# Patient Record
Sex: Male | Born: 1995 | Race: Black or African American | Hispanic: No | Marital: Single | Smoking: Current some day smoker
Health system: Southern US, Community
[De-identification: ages and names within clinical notes are randomized; demographics above are authoritative.]

## PROBLEM LIST (undated history)

## (undated) DIAGNOSIS — J302 Other seasonal allergic rhinitis: Secondary | ICD-10-CM

## (undated) DIAGNOSIS — Z889 Allergy status to unspecified drugs, medicaments and biological substances status: Secondary | ICD-10-CM

---
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
## 2015-04-08 ENCOUNTER — Emergency Department (INDEPENDENT_AMBULATORY_CARE_PROVIDER_SITE_OTHER)
Admission: EM | Admit: 2015-04-08 | Discharge: 2015-04-08 | Disposition: A | Discharge status: Home / Self Care | Payer: Medicaid Other | Source: Home / Self Care | Attending: Family Medicine | Admitting: Family Medicine

## 2015-04-08 ENCOUNTER — Encounter (HOSPITAL_COMMUNITY): Payer: Self-pay | Admitting: *Deleted

## 2015-04-08 ENCOUNTER — Other Ambulatory Visit (HOSPITAL_COMMUNITY)
Admission: RE | Admit: 2015-04-08 | Discharge: 2015-04-08 | Disposition: A | Discharge status: Home / Self Care | Payer: Medicaid Other | Source: Ambulatory Visit | Attending: Family Medicine | Admitting: Family Medicine

## 2015-04-08 DIAGNOSIS — Z711 Person with feared health complaint in whom no diagnosis is made: Secondary | ICD-10-CM

## 2015-04-08 DIAGNOSIS — Z113 Encounter for screening for infections with a predominantly sexual mode of transmission: Secondary | ICD-10-CM | POA: Insufficient documentation

## 2015-04-08 NOTE — ED Notes (Signed)
Pt  States  He  Was  Exposed  To  An  Std       He  Wants  To be  Checked  He  denys  Any  Symptoms

## 2015-04-08 NOTE — Discharge Instructions (Signed)
We will call with positive test results and treat as indicated  °

## 2015-04-08 NOTE — ED Provider Notes (Signed)
CSN: [id number]     Arrival date & time 04/08/15  1307 History   First MD Initiated Contact with Patient 04/08/15 1426     Chief Complaint  Patient presents with  . Exposure to STD   (Consider location/radiation/quality/duration/timing/severity/associated sxs/prior Treatment) Patient is a 19 y.o. male presenting with STD exposure. The history is provided by the patient.  Exposure to STD This is a new problem. Episode onset: told by girl that she had chlamydia. The problem has not changed since onset.Associated symptoms comments: Pt with no sx..    Past Medical History  Diagnosis Date  . Asthma   . Multiple allergies    History reviewed. No pertinent past surgical history. History reviewed. No pertinent family history. Social History  Substance Use Topics  . Smoking status: Current Some Day Smoker -- 0.25 packs/day    Types: Cigarettes  . Smokeless tobacco: Never Used  . Alcohol Use: No    Review of Systems  Gastrointestinal: Negative.   Genitourinary: Negative for urgency, discharge, penile swelling, penile pain and testicular pain.  All other systems reviewed and are negative.   Allergies  Review of patient's allergies indicates no known allergies.  Home Medications   Prior to Admission medications   Medication Sig Start Date End Date Taking? Authorizing Provider  lactobacillus acidophilus & bulgar (LACTINEX) chewable tablet Chew 1 tablet by mouth 3 (three) times daily with meals. For 5 days 06/03/13   Truddie Cocoamika Bush, DO   Meds Ordered and Administered this Visit  Medications - No data to display  BP 109/68 mmHg  Pulse 78  Temp(Src) 98.7 F (37.1 C) (Oral)  Resp 16  SpO2 97% No data found.   Physical Exam  Constitutional: He is oriented to person, place, and time. He appears well-developed and well-nourished. No distress.  Abdominal: Soft. Bowel sounds are normal.  Genitourinary: Penis normal. No penile tenderness.  Neurological: He is alert and oriented to  person, place, and time.  Skin: Skin is warm and dry.  Nursing note and vitals reviewed.   ED Course  Procedures (including critical care time)  Labs Review Labs Reviewed  CYTOLOGY, (ORAL, ANAL, URETHRAL) ANCILLARY ONLY    Imaging Review No results found.   Visual Acuity Review  Right Eye Distance:   Left Eye Distance:   Bilateral Distance:    Right Eye Near:   Left Eye Near:    Bilateral Near:         MDM   1. Concern about STD in male without diagnosis        Linna HoffJames D Desira Alessandrini, MD 04/08/15 [id number]

## 2015-04-09 LAB — CYTOLOGY, (ORAL, ANAL, URETHRAL) ANCILLARY ONLY
CHLAMYDIA, DNA PROBE: POSITIVE — AB
NEISSERIA GONORRHEA: NEGATIVE

## 2015-04-09 NOTE — ED Notes (Signed)
Final report of STD testing positive for chlamydia, untreated. Discussed w JD Kindl, who authorized call in order  for azithromycin 1 gram x 1 dose, NR. Called patient , and after verifying ID, discussed positive finding. Called Rx to [facility] at [address], [address] . Spoke directly [facility] staff. Patient was instructed to inform his partner of his positive chlamydia finding, and they are to avoid unprotected sex until 1 week post both parties completing treatment. He is also to practice safer sex. Patient verbalized understanding of plan. Form 2124 DHHS completed and [facility] to [facility] for their records.

## 2018-08-13 ENCOUNTER — Emergency Department (HOSPITAL_COMMUNITY)
Admission: EM | Admit: 2018-08-13 | Discharge: 2018-08-13 | Disposition: A | Discharge status: Home / Self Care | Payer: Self-pay | Attending: Emergency Medicine | Admitting: Emergency Medicine

## 2018-08-13 ENCOUNTER — Encounter (HOSPITAL_COMMUNITY): Payer: Self-pay

## 2018-08-13 ENCOUNTER — Other Ambulatory Visit: Payer: Self-pay

## 2018-08-13 DIAGNOSIS — R067 Sneezing: Secondary | ICD-10-CM | POA: Insufficient documentation

## 2018-08-13 DIAGNOSIS — F17210 Nicotine dependence, cigarettes, uncomplicated: Secondary | ICD-10-CM | POA: Insufficient documentation

## 2018-08-13 DIAGNOSIS — Z0279 Encounter for issue of other medical certificate: Secondary | ICD-10-CM | POA: Insufficient documentation

## 2018-08-13 HISTORY — DX: Other seasonal allergic rhinitis: J30.2

## 2018-08-13 NOTE — ED Triage Notes (Signed)
Pt states he's been sneezing since this am and work needs a note stating he can work.  Pt states he was playing basketball over the weekend outside and there was lots of pollen around.  No shortness of breath or complaints of pain. A&Ox4.

## 2018-08-13 NOTE — ED Provider Notes (Signed)
[facility] EMERGENCY DEPARTMENT Provider Note   CSN: [id number] Arrival date & time: 08/13/18  1811    History   Chief Complaint Chief Complaint  Patient presents with  . Allergies    HPI Thomas Clayton is a 23 y.o. male who presents for a work note.  He states that he has asthma and allergies and he sneezed today while he was at work.  He sneezed multiple times and told his coworkers that it was because of his allergies but they told him that he had to go get checked out.  He was told he needs a work note to go back to work.  He denies fever, chills, URI symptoms, cough, shortness of breath, chest pain.  He has been taking his allergy medicine as prescribed     HPI  Past Medical History:  Diagnosis Date  . Asthma   . Multiple allergies   . Seasonal allergies     There are no active problems to display for this patient.   History reviewed. No pertinent surgical history.      Home Medications    Prior to Admission medications   Medication Sig Start Date End Date Taking? Authorizing Provider  lactobacillus acidophilus & bulgar (LACTINEX) chewable tablet Chew 1 tablet by mouth 3 (three) times daily with meals. For 5 days Patient not taking: Reported on 08/13/2018 06/03/13   Truddie Coco, DO    Family History History reviewed. No pertinent family history.  Social History Social History   Tobacco Use  . Smoking status: Current Some Day Smoker    Packs/day: 0.25    Types: Cigarettes  . Smokeless tobacco: Never Used  Substance Use Topics  . Alcohol use: No  . Drug use: No     Allergies   Patient has no known allergies.   Review of Systems Review of Systems  Constitutional: Negative for fever.  HENT: Positive for sneezing.      Physical Exam Updated Vital Signs BP 132/82 (BP Location: Right Arm)   Pulse 90   Temp 98.3 F (36.8 C) (Oral)   Resp 17   SpO2 95%   Physical Exam Vitals signs and nursing note reviewed.   Constitutional:      General: He is not in acute distress.    Appearance: Normal appearance. He is well-developed. He is not ill-appearing.  HENT:     Head: Normocephalic and atraumatic.     Right Ear: Tympanic membrane normal.     Left Ear: Tympanic membrane normal.     Nose: Nose normal.     Mouth/Throat:     Mouth: Mucous membranes are moist.  Eyes:     General: No scleral icterus.       Right eye: No discharge.        Left eye: No discharge.     Conjunctiva/sclera: Conjunctivae normal.     Pupils: Pupils are equal, round, and reactive to light.  Neck:     Musculoskeletal: Normal range of motion.  Cardiovascular:     Rate and Rhythm: Normal rate and regular rhythm.  Pulmonary:     Effort: Pulmonary effort is normal. No respiratory distress.     Breath sounds: Normal breath sounds.  Abdominal:     General: There is no distension.  Skin:    General: Skin is warm and dry.  Neurological:     Mental Status: He is alert and oriented to person, place, and time.  Psychiatric:  Behavior: Behavior normal.      ED Treatments / Results  Labs (all labs ordered are listed, but only abnormal results are displayed) Labs Reviewed - No data to display  EKG None  Radiology No results found.  Procedures Procedures (including critical care time)  Medications Ordered in ED Medications - No data to display   Initial Impression / Assessment and Plan / ED Course  I have reviewed the triage vital signs and the nursing notes.  Pertinent labs & imaging results that were available during my care of the patient were reviewed by me and considered in my medical decision making (see chart for details).  23 year old male who is primarily here for a work note after sneezing at work today.  His vitals are normal.  Exam is unremarkable.  Symptoms sound clearly like allergies.  Work note was given.  Final Clinical Impressions(s) / ED Diagnoses   Final diagnoses:  [facility]    ED  Discharge Orders    None       Beryle Quant 08/13/18 1949    Raeford Razor, MD 08/13/18 2002

## 2018-08-26 ENCOUNTER — Emergency Department (HOSPITAL_COMMUNITY)
Admission: EM | Admit: 2018-08-26 | Discharge: 2018-08-26 | Disposition: A | Discharge status: Home / Self Care | Payer: Self-pay | Attending: Emergency Medicine | Admitting: Emergency Medicine

## 2018-08-26 ENCOUNTER — Other Ambulatory Visit: Payer: Self-pay

## 2018-08-26 ENCOUNTER — Encounter (HOSPITAL_COMMUNITY): Payer: Self-pay | Admitting: *Deleted

## 2018-08-26 DIAGNOSIS — F17210 Nicotine dependence, cigarettes, uncomplicated: Secondary | ICD-10-CM | POA: Insufficient documentation

## 2018-08-26 DIAGNOSIS — Z09 Encounter for follow-up examination after completed treatment for conditions other than malignant neoplasm: Secondary | ICD-10-CM | POA: Insufficient documentation

## 2018-08-26 DIAGNOSIS — J45909 Unspecified asthma, uncomplicated: Secondary | ICD-10-CM | POA: Insufficient documentation

## 2018-08-26 DIAGNOSIS — R067 Sneezing: Secondary | ICD-10-CM | POA: Insufficient documentation

## 2018-08-26 NOTE — ED Notes (Signed)
Patient verbalized understanding of discharge instructions and denies any further needs or questions at this time. VS stable. Patient ambulatory with steady gait.  

## 2018-08-26 NOTE — ED Provider Notes (Signed)
[facility] EMERGENCY DEPARTMENT Provider Note   CSN: [id number] Arrival date & time: 08/26/18  1231    History   Chief Complaint Chief Complaint  Patient presents with  . Letter for School/Work    HPI LEXTON HEARN is a 23 y.o. male.     JANI BROUHARD is a 23 y.o. male  with a PMH of asthma, allergies who presents to the Emergency Department requesting work note. Patient states that he is unable to go back to work until he has a note. He was seen in ED on 3/23 after sneezing at work. He has been home and self-quaranting since then. He reports that his symptoms then were c/w seasonal allergies. Provider note sounds c/w this as well. He reports that he is no longer sneezing. Never had any coughing or fevers. Feels back to his usual state of health and asymptomatic. Would like to go back to work.  The history is provided by the patient and medical records. No language interpreter was used.    Past Medical History:  Diagnosis Date  . Asthma   . Multiple allergies   . Seasonal allergies     There are no active problems to display for this patient.   History reviewed. No pertinent surgical history.      Home Medications    Prior to Admission medications   Medication Sig Start Date End Date Taking? Authorizing Provider  lactobacillus acidophilus & bulgar (LACTINEX) chewable tablet Chew 1 tablet by mouth 3 (three) times daily with meals. For 5 days Patient not taking: Reported on 08/13/2018 06/03/13   Truddie Coco, DO    Family History History reviewed. No pertinent family history.  Social History Social History   Tobacco Use  . Smoking status: Current Some Day Smoker    Packs/day: 0.25    Types: Cigarettes  . Smokeless tobacco: Never Used  Substance Use Topics  . Alcohol use: No  . Drug use: No     Allergies   Other   Review of Systems Review of Systems  HENT: Positive for sneezing (Resolved).   All other systems reviewed and are  negative.    Physical Exam Updated Vital Signs BP (!) 141/86 (BP Location: Right Arm)   Pulse 74   Temp 98.2 F (36.8 C) (Oral)   Resp 15   Ht 5\' 7"  (1.702 m)   Wt 88 kg   SpO2 97%   BMI 30.38 kg/m   Physical Exam Vitals signs and nursing note reviewed.  Constitutional:      General: He is not in acute distress.    Appearance: He is well-developed.  HENT:     Head: Normocephalic and atraumatic.  Neck:     Musculoskeletal: Neck supple.  Cardiovascular:     Rate and Rhythm: Normal rate and regular rhythm.     Heart sounds: Normal heart sounds. No murmur.  Pulmonary:     Effort: Pulmonary effort is normal. No respiratory distress.     Breath sounds: Normal breath sounds.  Abdominal:     General: There is no distension.     Palpations: Abdomen is soft.     Tenderness: There is no abdominal tenderness.  Skin:    General: Skin is warm and dry.  Neurological:     Mental Status: He is alert and oriented to person, place, and time.      ED Treatments / Results  Labs (all labs ordered are listed, but only abnormal results are displayed)  Labs Reviewed - No data to display  EKG None  Radiology No results found.  Procedures Procedures (including critical care time)  Medications Ordered in ED Medications - No data to display   Initial Impression / Assessment and Plan / ED Course  I have reviewed the triage vital signs and the nursing notes.  Pertinent labs & imaging results that were available during my care of the patient were reviewed by me and considered in my medical decision making (see chart for details).       TAELYN AMAN is a 23 y.o. male who presents to ED requesting work note. Seen in ED on 3/23. Chart reviewed from this encounter. He states that since he sneezed at work, employer would not let him return to work without a note. He has had no symptoms for the last week. Never had any cough, fever or GI symptoms. Symptoms sound much more c/w  seasonal allergies to begin with. Benign exam. Low risk. Feel he can return to work as he has self quarantined and asymptomatic for a week now. Note provided.   Patient discussed with Dr. Juleen China who agrees with treatment plan.   Final Clinical Impressions(s) / ED Diagnoses   Final diagnoses:  Follow-up exam    ED Discharge Orders    None       Karthik Whittinghill, [place], [place] 08/26/18 1323    Raeford Razor, MD 08/26/18 1505

## 2018-08-26 NOTE — ED Triage Notes (Signed)
Pt reports his company called off all employees for one week and now wants all workers to come back to work.  Pt 's  job now wants a return to work note. Pt denies cough ,sore throat  or fever.

## 2018-08-26 NOTE — Discharge Instructions (Signed)
Return to ER for new symptoms or any additional concerns.

## 2019-01-12 ENCOUNTER — Ambulatory Visit (HOSPITAL_COMMUNITY)
Admission: EM | Admit: 2019-01-12 | Discharge: 2019-01-13 | Disposition: A | Discharge status: Home / Self Care | Payer: Self-pay | Attending: Orthopedic Surgery | Admitting: Orthopedic Surgery

## 2019-01-12 ENCOUNTER — Emergency Department (HOSPITAL_COMMUNITY): Payer: Self-pay

## 2019-01-12 ENCOUNTER — Emergency Department (HOSPITAL_COMMUNITY): Payer: Self-pay | Admitting: Certified Registered Nurse Anesthetist

## 2019-01-12 ENCOUNTER — Other Ambulatory Visit: Payer: Self-pay

## 2019-01-12 ENCOUNTER — Emergency Department (HOSPITAL_COMMUNITY)
Admission: EM | Admit: 2019-01-12 | Discharge: 2019-01-12 | Discharge status: Against Medical Advice | Payer: Self-pay | Attending: Emergency Medicine | Admitting: Emergency Medicine

## 2019-01-12 ENCOUNTER — Encounter (HOSPITAL_COMMUNITY)
Admission: EM | Disposition: A | Discharge status: Home / Self Care | Payer: Self-pay | Source: Home / Self Care | Attending: Emergency Medicine

## 2019-01-12 ENCOUNTER — Encounter (HOSPITAL_COMMUNITY): Payer: Self-pay | Admitting: *Deleted

## 2019-01-12 DIAGNOSIS — W2209XA Striking against other stationary object, initial encounter: Secondary | ICD-10-CM | POA: Insufficient documentation

## 2019-01-12 DIAGNOSIS — S6291XA Unspecified fracture of right wrist and hand, initial encounter for closed fracture: Secondary | ICD-10-CM

## 2019-01-12 DIAGNOSIS — S62141A Displaced fracture of body of hamate [unciform] bone, right wrist, initial encounter for closed fracture: Secondary | ICD-10-CM | POA: Insufficient documentation

## 2019-01-12 DIAGNOSIS — Z5320 Procedure and treatment not carried out because of patient's decision for unspecified reasons: Secondary | ICD-10-CM | POA: Insufficient documentation

## 2019-01-12 DIAGNOSIS — Y999 Unspecified external cause status: Secondary | ICD-10-CM | POA: Insufficient documentation

## 2019-01-12 DIAGNOSIS — Y929 Unspecified place or not applicable: Secondary | ICD-10-CM | POA: Insufficient documentation

## 2019-01-12 DIAGNOSIS — J45909 Unspecified asthma, uncomplicated: Secondary | ICD-10-CM | POA: Insufficient documentation

## 2019-01-12 DIAGNOSIS — Y939 Activity, unspecified: Secondary | ICD-10-CM | POA: Insufficient documentation

## 2019-01-12 DIAGNOSIS — S62342A Nondisplaced fracture of base of third metacarpal bone, right hand, initial encounter for closed fracture: Secondary | ICD-10-CM | POA: Insufficient documentation

## 2019-01-12 DIAGNOSIS — F17210 Nicotine dependence, cigarettes, uncomplicated: Secondary | ICD-10-CM | POA: Insufficient documentation

## 2019-01-12 DIAGNOSIS — S62346A Nondisplaced fracture of base of fifth metacarpal bone, right hand, initial encounter for closed fracture: Secondary | ICD-10-CM | POA: Insufficient documentation

## 2019-01-12 DIAGNOSIS — S62309A Unspecified fracture of unspecified metacarpal bone, initial encounter for closed fracture: Secondary | ICD-10-CM

## 2019-01-12 DIAGNOSIS — S62344A Nondisplaced fracture of base of fourth metacarpal bone, right hand, initial encounter for closed fracture: Secondary | ICD-10-CM | POA: Insufficient documentation

## 2019-01-12 DIAGNOSIS — S62316A Displaced fracture of base of fifth metacarpal bone, right hand, initial encounter for closed fracture: Secondary | ICD-10-CM | POA: Insufficient documentation

## 2019-01-12 DIAGNOSIS — S62314A Displaced fracture of base of fourth metacarpal bone, right hand, initial encounter for closed fracture: Secondary | ICD-10-CM | POA: Insufficient documentation

## 2019-01-12 DIAGNOSIS — Z20828 Contact with and (suspected) exposure to other viral communicable diseases: Secondary | ICD-10-CM | POA: Insufficient documentation

## 2019-01-12 DIAGNOSIS — S62312A Displaced fracture of base of third metacarpal bone, right hand, initial encounter for closed fracture: Secondary | ICD-10-CM | POA: Insufficient documentation

## 2019-01-12 HISTORY — PX: PERCUTANEOUS PINNING: SHX2209

## 2019-01-12 LAB — SARS CORONAVIRUS 2 (TAT 6-24 HRS): SARS Coronavirus 2: NEGATIVE

## 2019-01-12 SURGERY — PINNING, EXTREMITY, PERCUTANEOUS
Anesthesia: General | Laterality: Right

## 2019-01-12 MED ORDER — ONDANSETRON HCL 4 MG/2ML IJ SOLN
4.0000 mg | Freq: Once | INTRAMUSCULAR | Status: AC
  Administered 2019-01-12: 4 mg via INTRAVENOUS
  Filled 2019-01-12: qty 2

## 2019-01-12 MED ORDER — FENTANYL CITRATE (PF) 250 MCG/5ML IJ SOLN
INTRAMUSCULAR | Status: AC
  Filled 2019-01-12: qty 5

## 2019-01-12 MED ORDER — BUPIVACAINE HCL (PF) 0.5 % IJ SOLN
INTRAMUSCULAR | Status: AC
  Filled 2019-01-12: qty 30

## 2019-01-12 MED ORDER — MIDAZOLAM HCL 5 MG/5ML IJ SOLN
INTRAMUSCULAR | Status: DC | PRN
  Administered 2019-01-12: 2 mg via INTRAVENOUS

## 2019-01-12 MED ORDER — PROPOFOL 10 MG/ML IV BOLUS
INTRAVENOUS | Status: AC
  Filled 2019-01-12: qty 20

## 2019-01-12 MED ORDER — MIDAZOLAM HCL 2 MG/2ML IJ SOLN
INTRAMUSCULAR | Status: AC
  Filled 2019-01-12: qty 2

## 2019-01-12 MED ORDER — PROPOFOL 10 MG/ML IV BOLUS
INTRAVENOUS | Status: DC | PRN
  Administered 2019-01-12: 50 mg via INTRAVENOUS
  Administered 2019-01-12: 200 mg via INTRAVENOUS

## 2019-01-12 MED ORDER — ONDANSETRON HCL 4 MG/2ML IJ SOLN
INTRAMUSCULAR | Status: DC | PRN
  Administered 2019-01-12: 4 mg via INTRAVENOUS

## 2019-01-12 MED ORDER — SUGAMMADEX SODIUM 200 MG/2ML IV SOLN
INTRAVENOUS | Status: DC | PRN
  Administered 2019-01-12: 200 mg via INTRAVENOUS

## 2019-01-12 MED ORDER — FENTANYL CITRATE (PF) 100 MCG/2ML IJ SOLN
INTRAMUSCULAR | Status: DC | PRN
  Administered 2019-01-12: 50 ug via INTRAVENOUS
  Administered 2019-01-12: 150 ug via INTRAVENOUS
  Administered 2019-01-12: 50 ug via INTRAVENOUS

## 2019-01-12 MED ORDER — OXYCODONE-ACETAMINOPHEN 5-325 MG PO TABS
1.0000 | ORAL_TABLET | Freq: Once | ORAL | Status: AC
  Administered 2019-01-12: 1 via ORAL
  Filled 2019-01-12: qty 1

## 2019-01-12 MED ORDER — HYDROMORPHONE HCL 1 MG/ML IJ SOLN
0.5000 mg | Freq: Once | INTRAMUSCULAR | Status: AC
  Administered 2019-01-12: 19:00:00 0.5 mg via INTRAVENOUS
  Filled 2019-01-12: qty 1

## 2019-01-12 MED ORDER — HYDROMORPHONE HCL 1 MG/ML IJ SOLN
INTRAMUSCULAR | Status: AC
  Filled 2019-01-12: qty 0.5

## 2019-01-12 MED ORDER — ROCURONIUM BROMIDE 100 MG/10ML IV SOLN
INTRAVENOUS | Status: DC | PRN
  Administered 2019-01-12: 50 mg via INTRAVENOUS

## 2019-01-12 MED ORDER — LIDOCAINE HCL (CARDIAC) PF 100 MG/5ML IV SOSY
PREFILLED_SYRINGE | INTRAVENOUS | Status: DC | PRN
  Administered 2019-01-12: 60 mg via INTRAVENOUS

## 2019-01-12 MED ORDER — HYDROMORPHONE HCL 1 MG/ML IJ SOLN
INTRAMUSCULAR | Status: DC | PRN
  Administered 2019-01-12: 0.5 mg via INTRAVENOUS

## 2019-01-12 MED ORDER — BUPIVACAINE HCL (PF) 0.25 % IJ SOLN
INTRAMUSCULAR | Status: DC | PRN
  Administered 2019-01-12: 10 mL

## 2019-01-12 MED ORDER — ACETAMINOPHEN 325 MG PO TABS
650.0000 mg | ORAL_TABLET | Freq: Once | ORAL | Status: AC
  Administered 2019-01-12: 650 mg via ORAL
  Filled 2019-01-12: qty 2

## 2019-01-12 MED ORDER — DEXAMETHASONE SODIUM PHOSPHATE 4 MG/ML IJ SOLN
INTRAMUSCULAR | Status: DC | PRN
  Administered 2019-01-12: 5 mg via INTRAVENOUS

## 2019-01-12 MED ORDER — PROPOFOL 1000 MG/100ML IV EMUL
INTRAVENOUS | Status: AC
  Filled 2019-01-12: qty 100

## 2019-01-12 MED ORDER — LACTATED RINGERS IV SOLN
INTRAVENOUS | Status: DC | PRN
  Administered 2019-01-12: 22:00:00 via INTRAVENOUS

## 2019-01-12 MED ORDER — BUPIVACAINE HCL (PF) 0.25 % IJ SOLN
INTRAMUSCULAR | Status: AC
  Filled 2019-01-12: qty 10

## 2019-01-12 SURGICAL SUPPLY — 57 items
APL PRP STRL LF DISP 70% ISPRP (MISCELLANEOUS) ×1
APL SKNCLS STERI-STRIP NONHPOA (GAUZE/BANDAGES/DRESSINGS) ×1
BENZOIN TINCTURE PRP APPL 2/3 (GAUZE/BANDAGES/DRESSINGS) ×3 IMPLANT
BLADE CLIPPER SURG (BLADE) ×3 IMPLANT
BNDG CMPR 9X4 STRL LF SNTH (GAUZE/BANDAGES/DRESSINGS) ×1
BNDG COHESIVE 2X5 TAN STRL LF (GAUZE/BANDAGES/DRESSINGS) IMPLANT
BNDG ELASTIC 3X5.8 VLCR STR LF (GAUZE/BANDAGES/DRESSINGS) ×3 IMPLANT
BNDG ELASTIC 4X5.8 VLCR STR LF (GAUZE/BANDAGES/DRESSINGS) ×3 IMPLANT
BNDG ESMARK 4X9 LF (GAUZE/BANDAGES/DRESSINGS) ×3 IMPLANT
BNDG GAUZE ELAST 4 BULKY (GAUZE/BANDAGES/DRESSINGS) ×3 IMPLANT
CHLORAPREP W/TINT 26 (MISCELLANEOUS) ×3 IMPLANT
CLOSURE WOUND 1/2 X4 (GAUZE/BANDAGES/DRESSINGS) ×1
CORD BIPOLAR FORCEPS 12FT (ELECTRODE) ×3 IMPLANT
COVER SURGICAL LIGHT HANDLE (MISCELLANEOUS) ×3 IMPLANT
COVER WAND RF STERILE (DRAPES) ×3 IMPLANT
CUFF TOURN SGL QUICK 18X4 (TOURNIQUET CUFF) IMPLANT
CUFF TOURN SGL QUICK 24 (TOURNIQUET CUFF)
CUFF TRNQT CYL 24X4X16.5-23 (TOURNIQUET CUFF) IMPLANT
DRAPE OEC MINIVIEW 54X84 (DRAPES) ×6 IMPLANT
DRAPE SURG 17X23 STRL (DRAPES) ×3 IMPLANT
DRSG EMULSION OIL 3X3 NADH (GAUZE/BANDAGES/DRESSINGS) ×3 IMPLANT
DRSG XEROFORM 1X8 (GAUZE/BANDAGES/DRESSINGS) ×2 IMPLANT
GAUZE SPONGE 4X4 12PLY STRL (GAUZE/BANDAGES/DRESSINGS) ×3 IMPLANT
GAUZE SPONGE 4X4 12PLY STRL LF (GAUZE/BANDAGES/DRESSINGS) ×2 IMPLANT
GAUZE XEROFORM 1X8 LF (GAUZE/BANDAGES/DRESSINGS) ×3 IMPLANT
GLOVE BIO SURGEON STRL SZ7.5 (GLOVE) ×3 IMPLANT
GLOVE BIOGEL PI IND STRL 8 (GLOVE) ×1 IMPLANT
GLOVE BIOGEL PI INDICATOR 8 (GLOVE) ×2
GOWN STRL REUS W/ TWL LRG LVL3 (GOWN DISPOSABLE) ×3 IMPLANT
GOWN STRL REUS W/TWL LRG LVL3 (GOWN DISPOSABLE) ×9
K-WIRE DBL TROCAR .045X4 ×12 IMPLANT
KIT BASIN OR (CUSTOM PROCEDURE TRAY) ×3 IMPLANT
KIT TURNOVER KIT B (KITS) ×3 IMPLANT
KWIRE DBL TROCAR .045X4 IMPLANT
MANIFOLD NEPTUNE II (INSTRUMENTS) ×3 IMPLANT
NDL HYPO 25GX1X1/2 BEV (NEEDLE) ×1 IMPLANT
NEEDLE HYPO 25GX1X1/2 BEV (NEEDLE) ×3 IMPLANT
NS IRRIG 1000ML POUR BTL (IV SOLUTION) ×3 IMPLANT
PACK ORTHO EXTREMITY (CUSTOM PROCEDURE TRAY) ×3 IMPLANT
PAD ARMBOARD 7.5X6 YLW CONV (MISCELLANEOUS) ×6 IMPLANT
PAD CAST 3X4 CTTN HI CHSV (CAST SUPPLIES) IMPLANT
PADDING CAST COTTON 3X4 STRL (CAST SUPPLIES) ×3
SPLINT PLASTER EXTRA FAST 3X15 (CAST SUPPLIES) ×2
SPLINT PLASTER GYPS XFAST 3X15 (CAST SUPPLIES) IMPLANT
STRIP CLOSURE SKIN 1/2X4 (GAUZE/BANDAGES/DRESSINGS) ×2 IMPLANT
SUCTION FRAZIER HANDLE 10FR (MISCELLANEOUS) ×2
SUCTION TUBE FRAZIER 10FR DISP (MISCELLANEOUS) ×1 IMPLANT
SUT ETHILON 4 0 P 3 18 (SUTURE) IMPLANT
SUT ETHILON 4 0 PS 2 18 (SUTURE) ×4 IMPLANT
SUT PROLENE 4 0 P 3 18 (SUTURE) IMPLANT
SYR CONTROL 10ML LL (SYRINGE) ×3 IMPLANT
TOWEL GREEN STERILE (TOWEL DISPOSABLE) ×3 IMPLANT
TOWEL GREEN STERILE FF (TOWEL DISPOSABLE) ×3 IMPLANT
TUBE CONNECTING 12'X1/4 (SUCTIONS) ×1
TUBE CONNECTING 12X1/4 (SUCTIONS) ×2 IMPLANT
TUBE FEEDING ENTERAL 5FR 16IN (TUBING) IMPLANT
WATER STERILE IRR 1000ML POUR (IV SOLUTION) ×3 IMPLANT

## 2019-01-12 NOTE — ED Triage Notes (Signed)
PT walked out with Family member  AMA.

## 2019-01-12 NOTE — ED Triage Notes (Signed)
PT returns after leaving AMA because he refused COVID -19 swab

## 2019-01-12 NOTE — Op Note (Signed)
NAME: Thomas Clayton RECORD NO: [id number] DATE OF BIRTH: Jun 11, 1995 FACILITY: [facility] LOCATION: [facility] OR PHYSICIAN: Tennis Must, MD   OPERATIVE REPORT   DATE OF PROCEDURE: 01/12/19    PREOPERATIVE DIAGNOSIS:   Right long finger metacarpal base fracture right ring finger CMC fracture dislocation right small finger CMC dislocation right hamate fracture   POSTOPERATIVE DIAGNOSIS:  Right long finger metacarpal base fracture right ring finger CMC fracture dislocation right small finger CMC dislocation right hamate fracture   PROCEDURE:   1.  Right long finger reduction and pinning of metacarpal base fracture 2.  Right ring finger reduction and pinning of CMC fracture dislocation 3.  Right small finger reduction and pinning CMC dislocation 4.  Closed reduction and pinning of hamate fracture 5.  Prophylactic fasciotomies right hand   SURGEON:  Leanora Cover, M.D.   ASSISTANT: none   ANESTHESIA:  General   INTRAVENOUS FLUIDS:  Per anesthesia flow sheet.   ESTIMATED BLOOD LOSS:  Minimal.   COMPLICATIONS:  None.   SPECIMENS:  none   TOURNIQUET TIME:    Total Tourniquet Time Documented: Upper Arm (Right) - 94 minutes Total: Upper Arm (Right) - 94 minutes    DISPOSITION:  Stable to PACU.   INDICATIONS: 23 year old right-hand-dominant male states he punched a wall earlier today injuring his right hand.  Seen at [facility] emergency department where radiographs were taken revealing fracture of the base of the long finger metacarpal a fracture dislocation of the ring finger carpometacarpal joint with fracture of the metacarpal base dislocation of the small finger metacarpal CMC joint fracture of the dorsal rim of the hamate.  Recommended operative reduction and pinning with prophylactic fasciotomies. Risks, benefits and alternatives of surgery were discussed including the risks of blood loss, infection, damage to nerves, vessels, tendons, ligaments, bone for surgery, need for  additional surgery, complications with wound healing, continued pain, nonunion, malunion, stiffness.  He voiced understanding of these risks and elected to proceed.  OPERATIVE COURSE:  After being identified preoperatively by myself,  the patient and I agreed on the procedure and site of the procedure.  The surgical site was marked.  Surgical consent had been signed. He was given IV antibiotics as preoperative antibiotic prophylaxis. He was transferred to the operating room and placed on the operating table in supine position with the Right upper extremity on an arm board.  General anesthesia was induced by the anesthesiologist.  Right upper extremity was prepped and draped in normal sterile orthopedic fashion.  A surgical pause was performed between the surgeons, anesthesia, and operating room staff and all were in agreement as to the patient, procedure, and site of procedure.  Tourniquet at the proximal aspect of the extremity was inflated to 250 mmHg after exsanguination of the arm with an Esmarch bandage.    C-arm was used to aid in reduction of the fractures.  The fracture dislocations were able to be reduced but were unstable.  An incision was made over the ring finger metacarpal and carried in subcutaneous tissues by spreading technique.  Bipolar electrocautery is used to obtain hemostasis.  Fasciotomies of the hand were performed.  The fascia between the ring and small fingers was released.  The muscle was spread to release deep fascia.  The fascia between the long and ring finger metacarpals was released and again the muscle was spread to release any deep fascia.  Additional incision was made over the index finger metacarpal.  This is again carried in subcutaneous  tissues by spreading technique.  Bipolar cautery was used to obtain hemostasis.  There was hematoma in the dorsum of the hand.  The fascia between the index and long finger metacarpals was released again spreading the muscle to release any deep  fascia.  The fascia between the thumb and index finger metacarpals was released.  The muscle all appeared viable.  The wounds were copiously irrigated with sterile saline.  They were closed with 4-0 nylon in a horizontal mattress fashion.  There was no undue tension.  C-arm was again used in AP and lateral projections to aid in reduction of the fractures and dislocations.  0.045 inch K wires were then used.  2 K wires were used to stabilize the long finger metacarpal base fracture.  A 0.045 inch K wire was then used to stabilize the fracture dislocation of the ring finger CMC joint and base of metacarpal fractures.  A 0.045 inch K wire was used to stabilize the dislocation of the small finger CMC joint.  C-arm was used in AP lateral oblique projections to ensure appropriate reduction position of heart which was the case.  There was a fracture of the dorsal rim of the hamate.  This piece seemed large enough to stabilize with a pin.  A 0.045 inch K wire was then used to capture this fragment aid in reduction of the fragment and pin it to the body of the hamate.  C-arm was again used in AP lateral oblique projections to ensure appropriate reduction position of heart which was the case.  Wrist was placed through tenodesis and there was no scissoring.  The pins were all bent and cut short.  The surgical field was injected with quarter percent plain Marcaine to aid in postoperative analgesia.  The wounds and pin sites were dressed with sterile Xeroform 4 x 4's and wrapped with a Kerlix bandage.  A volar dorsal slab splint including the long ring and small fingers was placed with the MPs flexed and the IP is extended.  This was wrapped with Kerlix and Ace bandage.  The tourniquet was deflated at 94 minutes.  Fingertips were pink with brisk capillary refill after deflation of tourniquet.  The operative  drapes were broken down.  The patient was awoken from anesthesia safely.  He was transferred back to the stretcher and  taken to PACU in stable condition.  I will see him back in the office in 1 week for postoperative followup.  I will give him a prescription for Percocet 5/325 1-2 tabs PO q6 hours prn pain, dispense # 30.   Betha LoaKevin Ej Pinson, MD Electronically signed, 01/12/19

## 2019-01-12 NOTE — ED Notes (Addendum)
Patient transported to CT 

## 2019-01-12 NOTE — H&P (Signed)
Thomas Clayton is an 23 y.o. male.   Chief Complaint: right hand fracture/dislocation HPI: 23 yo rhd male states he punched a wall earlier today injuring right hand.  Reports no previous injury to right hand and no other injury at this time.  Describes a throbbing pain that is alleviated by rest and aggravated with motion or palpation.  Associated swelling of hand.  Case discussed with Carmon Sails, [facility] and her note from 01/12/2019 reviewed. Xrays viewed and interpreted by me: 3 views right hand and CT right wrist show long finger metacarpal base fracture, ring finger metacarpal base fracture dislocation, small finger cmc dislocation, hamate dorsal rim fracture Labs reviewed: none  Allergies:  Allergies  Allergen Reactions  . Other Hives    Past Medical History:  Diagnosis Date  . Asthma   . Multiple allergies   . Seasonal allergies     History reviewed. No pertinent surgical history.  Family History: History reviewed. No pertinent family history.  Social History:   reports that he has been smoking cigarettes. He has been smoking about 0.25 packs per day. He has never used smokeless tobacco. He reports that he does not drink alcohol or use drugs.  Medications: (Not in a hospital admission)   No results found for this or any previous visit (from the past 48 hour(s)).  Ct Hand Right Wo Contrast  Result Date: 01/12/2019 CLINICAL DATA:  Right hand pain and swelling.  Hit the wall. EXAM: CT OF THE RIGHT HAND WITHOUT CONTRAST TECHNIQUE: Multidetector CT imaging of the right hand was performed according to the standard protocol. Multiplanar CT image reconstructions were also generated. COMPARISON:  None. FINDINGS: Bones/Joint/Cartilage Acute comminuted fracture at the base of the third metacarpal with 10 mm of dorsal displacement and without involvement of the articular surface. Comminuted fracture of the base of the fourth metacarpal with 10 mm of dorsal displacement involving the  articular surface. Base of the fourth metacarpal is dorsally dislocated and perched on the distal dorsal corner of the capitate. Comminuted fracture of the distal dorsal aspect of the hamate with 9 mm of maximum dorsal displacement. Dorsal dislocation of the fifth metacarpal base perched on the dorsal distal aspect of the hamate between the hamate and the dorsally displaced hamate fracture fragments. No other acute fracture or dislocation. No aggressive osseous lesion. No periosteal reaction. CMC joint effusion. Ligaments Suboptimally assessed by CT. Muscles and Tendons Muscles are normal. No muscle atrophy. Flexor and extensor compartment tendons are intact. Soft tissues No fluid collection or hematoma. No aggressive osseous lesion. Soft tissue edema around the dorsal aspect of the CMC joints. IMPRESSION: 1. Acute comminuted fracture at the base of the third metacarpal with 10 mm of dorsal displacement and without involvement of the articular surface. 2. Comminuted fracture of the base of the fourth metacarpal with 10 mm of dorsal displacement involving the articular surface. Base of the fourth metacarpal is dorsally dislocated and perched on the distal dorsal corner of the capitate. 3. Comminuted fracture of the distal dorsal aspect of the hamate with 9 mm of maximum dorsal displacement. 4. Dorsal dislocation of the fifth metacarpal base perched on the dorsal distal aspect of the hamate between the hamate and the dorsally displaced hamate fracture fragments. Electronically Signed   By: Kathreen Devoid   On: 01/12/2019 15:16   Dg Hand Complete Right  Result Date: 01/12/2019 CLINICAL DATA:  Trauma, injury, pain EXAM: RIGHT HAND - COMPLETE 3+ VIEW COMPARISON:  None. FINDINGS: There are acute  fractures of the right third, fourth, and fifth metacarpal bases and associated dorsal dislocation of the fourth and fifth metacarpal bases in relation to the carpal bones. Osseous irregularity over the hamate bone. Difficult to  exclude hamate fracture. Other carpal bones appear intact. Distal radius and ulna intact. Diffuse and soft tissue swelling. IMPRESSION: Acute right third, fourth and fifth metacarpal base fractures and associated fourth and fifth metacarpal base dislocations in relation to the carpal bones. Difficult to exclude hamate carpal bone fracture as well Marked soft tissue swelling. Electronically Signed   By: Judie PetitM.  Shick M.D.   On: 01/12/2019 12:58     A comprehensive review of systems was negative. Review of Systems: No fevers, chills, night sweats, chest pain, shortness of breath, nausea, vomiting, diarrhea, constipation, easy bleeding or bruising, headaches, dizziness, vision changes, fainting.   Blood pressure (!) 141/89, pulse 68, temperature 98.2 F (36.8 C), temperature source Oral, resp. rate 16, height 5\' 6"  (1.676 m), weight 86.2 kg, SpO2 98 %.  General appearance: alert, cooperative and appears stated age Head: Normocephalic, without obvious abnormality, atraumatic Neck: supple, symmetrical, trachea midline Resp: clear to auscultation bilaterally Cardio: regular rate and rhythm Extremities: Intact sensation and capillary refill all digits.  +epl/fpl/io.  No wounds. Right hand swollen.  Able to wiggle fingers lightly. Pulses: 2+ and symmetric Skin: Skin color, texture, turgor normal. No rashes or lesions Neurologic: Grossly normal Incision/Wound: none  Assessment/Plan Right hand long/ring/small fractures/cmc fracture dislocations and hamate fracture.  Recommend OR for reduction and fixation of metacarpal fractures and cmc fracture dislocations and hamate fracture with prophylactic fasciotomies of hand.  Risks, benefits and alternatives of surgery were discussed including risks of blood loss, infection, damage to nerves/vessels/tendons/ligament/bone, failure of surgery, need for additional surgery, complication with wound healing, stiffness, post traumatic arthritis.  He voiced understanding of  these risks and elected to proceed.    Betha LoaKevin Sierra Spargo 01/12/2019, 6:50 PM

## 2019-01-12 NOTE — ED Notes (Signed)
Patient provided ice to apply to injury per EDP, Harris.

## 2019-01-12 NOTE — ED Provider Notes (Signed)
[facility] EMERGENCY DEPARTMENT Provider Note   CSN: [id number] Arrival date & time: 01/12/19  1001     History   Chief Complaint Chief Complaint  Patient presents with  . Hand Pain    HPI Thomas Clayton is a 23 y.o. male presents to the ER for evaluation of right hand pain.  Sudden onset today after he punched a wall.  The pain is mild to moderate, worse with any movement and palpation.  Associated with swelling to the hand.  He is right-hand dominant.  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  He denies any other injuries.  He denies any tingling or loss of sensation distally.  No previous history of surgeries or injuries to that hand.  No interventions.  Alleviating factors.     HPI  Past Medical History:  Diagnosis Date  . Asthma   . Multiple allergies   . Seasonal allergies     There are no active problems to display for this patient.   History reviewed. No pertinent surgical history.      Home Medications    Prior to Admission medications   Medication Sig Start Date End Date Taking? Authorizing Provider  lactobacillus acidophilus & bulgar (LACTINEX) chewable tablet Chew 1 tablet by mouth 3 (three) times daily with meals. For 5 days Patient not taking: Reported on 08/13/2018 06/03/13   Glynis Smiles, DO    Family History No family history on file.  Social History Social History   Tobacco Use  . Smoking status: Current Some Day Smoker    Packs/day: 0.25    Types: Cigarettes  . Smokeless tobacco: Never Used  Substance Use Topics  . Alcohol use: No  . Drug use: No     Allergies   Other   Review of Systems Review of Systems  Musculoskeletal: Positive for arthralgias and joint swelling.  All other systems reviewed and are negative.    Physical Exam Updated Vital Signs BP (!) 150/99 (BP Location: Left Arm)   Pulse 97   Temp 98.9 F (37.2 C) (Oral)    Resp 16   Ht 5\' 6"  (1.676 m)   Wt 86.2 kg   SpO2 98%   BMI 30.67 kg/m   Physical Exam Constitutional:      Appearance: He is well-developed.  HENT:     Head: Normocephalic.     Nose: Nose normal.  Eyes:     General: Lids are normal.  Neck:     Musculoskeletal: Normal range of motion.  Cardiovascular:     Rate and Rhythm: Normal rate.  Pulmonary:     Effort: Pulmonary effort is normal. No respiratory distress.  Musculoskeletal: Normal range of motion.        General: Swelling and tenderness present.     Comments: Market edema to the entire right dorsum hand.  There is diffuse tenderness along the second, third, fourth, fifth metacarpals but most severely at the base of the third fourth and fifth.  Mild tenderness to the lateral dorsal wrist bones.  No focal bony tenderness to the finger IP joints, phalanges or MCPs.  No point tenderness to the scaphoid.  Negative scaphoid compression test.  Patient can make a full fist with some pain and there is no significant finger deviation outward.  Neurological:     Mental Status: He is alert.  Psychiatric:        Behavior:  Behavior normal.      ED Treatments / Results  Labs (all labs ordered are listed, but only abnormal results are displayed) Labs Reviewed  SARS CORONAVIRUS 2 (HOSPITAL ORDER[facility])    EKG None  Radiology Ct Hand Right Wo Contrast  Result Date: 01/12/2019 CLINICAL DATA:  Right hand pain and swelling.  Hit the wall. EXAM: CT OF THE RIGHT HAND WITHOUT CONTRAST TECHNIQUE: Multidetector CT imaging of the right hand was performed according to the standard protocol. Multiplanar CT image reconstructions were also generated. COMPARISON:  None. FINDINGS: Bones/Joint/Cartilage Acute comminuted fracture at the base of the third metacarpal with 10 mm of dorsal displacement and without involvement of the articular surface. Comminuted fracture of the base of the fourth metacarpal with 10 mm of  dorsal displacement involving the articular surface. Base of the fourth metacarpal is dorsally dislocated and perched on the distal dorsal corner of the capitate. Comminuted fracture of the distal dorsal aspect of the hamate with 9 mm of maximum dorsal displacement. Dorsal dislocation of the fifth metacarpal base perched on the dorsal distal aspect of the hamate between the hamate and the dorsally displaced hamate fracture fragments. No other acute fracture or dislocation. No aggressive osseous lesion. No periosteal reaction. CMC joint effusion. Ligaments Suboptimally assessed by CT. Muscles and Tendons Muscles are normal. No muscle atrophy. Flexor and extensor compartment tendons are intact. Soft tissues No fluid collection or hematoma. No aggressive osseous lesion. Soft tissue edema around the dorsal aspect of the CMC joints. IMPRESSION: 1. Acute comminuted fracture at the base of the third metacarpal with 10 mm of dorsal displacement and without involvement of the articular surface. 2. Comminuted fracture of the base of the fourth metacarpal with 10 mm of dorsal displacement involving the articular surface. Base of the fourth metacarpal is dorsally dislocated and perched on the distal dorsal corner of the capitate. 3. Comminuted fracture of the distal dorsal aspect of the hamate with 9 mm of maximum dorsal displacement. 4. Dorsal dislocation of the fifth metacarpal base perched on the dorsal distal aspect of the hamate between the hamate and the dorsally displaced hamate fracture fragments. Electronically Signed   By: Elige KoHetal  Patel   On: 01/12/2019 15:16   Dg Hand Complete Right  Result Date: 01/12/2019 CLINICAL DATA:  Trauma, injury, pain EXAM: RIGHT HAND - COMPLETE 3+ VIEW COMPARISON:  None. FINDINGS: There are acute fractures of the right third, fourth, and fifth metacarpal bases and associated dorsal dislocation of the fourth and fifth metacarpal bases in relation to the carpal bones. Osseous irregularity  over the hamate bone. Difficult to exclude hamate fracture. Other carpal bones appear intact. Distal radius and ulna intact. Diffuse and soft tissue swelling. IMPRESSION: Acute right third, fourth and fifth metacarpal base fractures and associated fourth and fifth metacarpal base dislocations in relation to the carpal bones. Difficult to exclude hamate carpal bone fracture as well Marked soft tissue swelling. Electronically Signed   By: Judie PetitM.  Shick M.D.   On: 01/12/2019 12:58    Procedures Procedures (including critical care time)  Medications Ordered in ED Medications  oxyCODONE-acetaminophen (PERCOCET/ROXICET) 5-325 MG per tablet 1 tablet (1 tablet Oral Given 01/12/19 1204)  acetaminophen (TYLENOL) tablet 650 mg (650 mg Oral Given 01/12/19 1204)     Initial Impression / Assessment and Plan / ED Course  I have reviewed the triage vital signs and the nursing notes.  Pertinent labs & imaging results that were available during my care of  the patient were reviewed by me and considered in my medical decision making (see chart for details).  Clinical Course as of Jan 11 1625  Sat Jan 12, 2019  1305 There are acute fractures of the right third, fourth, and fifth metacarpal bases and associated dorsal dislocation of the fourth and fifth metacarpal bases in relation to the carpal bones. Osseous irregularity over the hamate bone. Difficult to exclude hamate fracture. Other carpal bones appear intact. Distal radius and ulna intact. Diffuse and soft tissue swelling.  IMPRESSION: Acute right third, fourth and fifth metacarpal base fractures and associated fourth and fifth metacarpal base dislocations in relation to the carpal bones.  Difficult to exclude hamate carpal bone fracture as well  Marked soft tissue swelling.  DG Hand Complete Right [CG]  1625 IMPRESSION: 1. Acute comminuted fracture at the base of the third metacarpal with 10 mm of dorsal displacement and without involvement of the  articular surface. 2. Comminuted fracture of the base of the fourth metacarpal with 10 mm of dorsal displacement involving the articular surface. Base of the fourth metacarpal is dorsally dislocated and perched on the distal dorsal corner of the capitate. 3. Comminuted fracture of the distal dorsal aspect of the hamate with 9 mm of maximum dorsal displacement. 4. Dorsal dislocation of the fifth metacarpal base perched on the dorsal distal aspect of the hamate between the hamate and the dorsally displaced hamate fracture fragments.   CT Hand Right Wo Contrast [CG]    Clinical Course User Index [CG] Liberty HandyGibbons,  J, PA-C   Traumatic multiple right metacarpal fractures at the bases with dislocation and hamate fracture.  Extremity is neurovascularly intact.  He has good range of motion of the digits.  No other injuries.  He is right-hand dominant.  Discussed x-ray with Dr. Merlyn LotKuzma who recommends admission for orthopedic surgery, CT of the hand.  Discussed results with patient and father at bedside.  Patient became very angry and stated he did not want to be tested for COVID because he does not have it.  Explained that this is needed for surgery.  He was upset that he had been waiting in the ER for 5 hours to be told that he had to wait longer for surgery.  Apologized for delay but explained that surgery is recommended due to his multiple fractures on dominant hand.  Father assisted in de-escalating and patient ultimately was agreeable to obtain CT and COVID test for surgery.  Plan for admission by Dr. Merlyn LotKuzma for surgery tonight.  1625: While I was in another patient's room RN reported patient became upset again and was fighting with the father.  Patient walked out of the ER AMA.  I was not able to speak to him prior to leaving the hospital.  I called Dr. Merlyn LotKuzma to notify him of this.  Final Clinical Impressions(s) / ED Diagnoses   Final diagnoses:  Closed fracture of multiple metacarpal bones,  initial encounter    ED Discharge Orders    None       Liberty HandyGibbons,  J, PA-C 01/12/19 1626    Margarita Grizzleay, Danielle, MD 01/13/19 1122

## 2019-01-12 NOTE — ED Notes (Signed)
Ice pack applied.

## 2019-01-12 NOTE — ED Triage Notes (Signed)
C/o pain in right hand swelling , states he got mad and hit a wall.

## 2019-01-12 NOTE — Anesthesia Procedure Notes (Signed)
Procedure Name: Intubation Date/Time: 01/12/2019 9:55 PM Performed by: Oletta Lamas, CRNA Pre-anesthesia Checklist: Patient identified, Emergency Drugs available, Suction available and Patient being monitored Patient Re-evaluated:Patient Re-evaluated prior to induction Oxygen Delivery Method: Circle System Utilized Preoxygenation: Pre-oxygenation with 100% oxygen Induction Type: IV induction Ventilation: Mask ventilation without difficulty Laryngoscope Size: Mac and 4 Grade View: Grade I Tube type: Oral Tube size: 7.5 mm Number of attempts: 1 Airway Equipment and Method: Stylet Placement Confirmation: ETT inserted through vocal cords under direct vision,  positive ETCO2 and breath sounds checked- equal and bilateral Secured at: 23 cm Tube secured with: Tape Dental Injury: Teeth and Oropharynx as per pre-operative assessment

## 2019-01-12 NOTE — ED Triage Notes (Signed)
Pt wanted DC papers when walking out. Pt informed DC papers not available  When leaving AMA.

## 2019-01-12 NOTE — ED Notes (Signed)
Pt back from X-ray.  

## 2019-01-12 NOTE — ED Notes (Signed)
Pt changed to hospital gown and socks. Personal clothes and belongings placed in belonging bags with pt.

## 2019-01-12 NOTE — ED Triage Notes (Signed)
Pt agitated pacing in room . Unable to collect  COVID  19 . Pt reports it is his right what to do with his body. Nobody is going stick that up my nose.

## 2019-01-12 NOTE — ED Notes (Signed)
Patient transported to CT 

## 2019-01-12 NOTE — ED Triage Notes (Signed)
Reported to PA that Pt walked out with Family member.

## 2019-01-12 NOTE — ED Notes (Signed)
Pt taken to the OR.  Family took belongings to car.  Family waiting in car, would like to be called when the surgery is complete.  They will wait until pt is ready for discharge to take pt home.  Laurey Arrow - [phone] - family contact.  RN contacted family by phone to update them.

## 2019-01-12 NOTE — ED Notes (Signed)
Paged Dr. Fredna Dow Per. PA Carmon Sails.

## 2019-01-12 NOTE — Anesthesia Preprocedure Evaluation (Addendum)
Anesthesia Evaluation  Patient identified by MRN, date of birth, ID band Patient awake    Reviewed: Allergy & Precautions, NPO status , Patient's Chart, lab work & pertinent test results  Airway Mallampati: II  TM Distance: >3 FB Neck ROM: Full    Dental no notable dental hx.    Pulmonary asthma , Current SmokerPatient did not abstain from smoking.,    Pulmonary exam normal breath sounds clear to auscultation       Cardiovascular negative cardio ROS Normal cardiovascular exam Rhythm:Regular Rate:Normal     Neuro/Psych negative neurological ROS  negative psych ROS   GI/Hepatic negative GI ROS, (+)     substance abuse  ,   Endo/Other  negative endocrine ROS  Renal/GU negative Renal ROS     Musculoskeletal negative musculoskeletal ROS (+)   Abdominal (+) + obese,   Peds  Hematology negative hematology ROS (+)   Anesthesia Other Findings hand injury  Reproductive/Obstetrics                            Anesthesia Physical Anesthesia Plan  ASA: II and emergent  Anesthesia Plan: General   Post-op Pain Management:    Induction: Intravenous  PONV Risk Score and Plan: 1 and Treatment may vary due to age or medical condition, Ondansetron, Dexamethasone and Midazolam  Airway Management Planned: Oral ETT  Additional Equipment:   Intra-op Plan:   Post-operative Plan: Extubation in OR  Informed Consent: I have reviewed the patients History and Physical, chart, labs and discussed the procedure including the risks, benefits and alternatives for the proposed anesthesia with the patient or authorized representative who has indicated his/her understanding and acceptance.     Dental advisory given  Plan Discussed with: CRNA  Anesthesia Plan Comments:         Anesthesia Quick Evaluation

## 2019-01-12 NOTE — ED Provider Notes (Signed)
[facility]   CSN: [id number] Arrival date & time: 01/12/19  1620     History   Chief Complaint Chief Complaint  Patient presents with   Hand Injury    HPI Lahoma RockerDeion L Sorter is a 23 y.o. male  Who returns after eloping this morning. He has a Known R hand  Fracture dislocation after punching a wall this morning. He states that he wanted his wife to be with him. He needs ORIF. Dr. Merlyn LotKuzma called the patient and asked him to return to the ED. Patient has agreed to the Covid test. He denies numbness or tingling. He is R hand dominant. He is NPO since yesterday.     HPI  Past Medical History:  Diagnosis Date   Asthma    Multiple allergies    Seasonal allergies     There are no active problems to display for this patient.   History reviewed. No pertinent surgical history.      Home Medications    Prior to Admission medications   Medication Sig Start Date End Date Taking? Authorizing Provider  oxyCODONE-acetaminophen (PERCOCET) 5-325 MG tablet 1-2 tabs PO q6 hours prn pain 01/13/19   Betha LoaKuzma, Kevin, MD    Family History History reviewed. No pertinent family history.  Social History Social History   Tobacco Use   Smoking status: Current Some Day Smoker    Packs/day: 0.25    Types: Cigarettes   Smokeless tobacco: Never Used  Substance Use Topics   Alcohol use: No   Drug use: No     Allergies   Other   Review of Systems Review of Systems  Ten systems reviewed and are negative for acute change, except as noted in the HPI.   Physical Exam Updated Vital Signs BP (!) 171/98 (BP Location: Left Arm)    Pulse 91    Temp (!) 97 F (36.1 C)    Resp (!) 28    Ht 5\' 6"  (1.676 m)    Wt 86.2 kg    SpO2 100%    BMI 30.67 kg/m   Physical Exam Vitals signs and nursing note reviewed.  Constitutional:      General: He is not in acute distress.    Appearance: He is well-developed. He is not diaphoretic.  HENT:     Head: Normocephalic and atraumatic.  Eyes:     General: No scleral icterus.    Conjunctiva/sclera: Conjunctivae normal.  Neck:     Musculoskeletal: Normal range of motion and neck supple.  Cardiovascular:     Rate and Rhythm: Normal rate and regular rhythm.     Heart sounds: Normal heart sounds.  Pulmonary:     Effort: Pulmonary effort is normal. No respiratory distress.     Breath sounds: Normal breath sounds.  Abdominal:     Palpations: Abdomen is soft.     Tenderness: There is no abdominal tenderness.  Musculoskeletal:     Comments: Remarkably swelling with the bruising on the palmar surface.  Able to wiggle fingers, cap refill less than 2 seconds, normal ipsilateral elbow and shoulder examination.  Skin:    General: Skin is warm and dry.  Neurological:     Mental Status: He is alert.  Psychiatric:        Behavior: Behavior normal.      ED Treatments / Results  Labs (all labs ordered are listed, but only abnormal results are displayed) Labs Reviewed  SARS CORONAVIRUS 2  EKG None  Radiology Ct Hand Right Wo Contrast  Result Date: 01/12/2019 CLINICAL DATA:  Right hand pain and swelling.  Hit the wall. EXAM: CT OF THE RIGHT HAND WITHOUT CONTRAST TECHNIQUE: Multidetector CT imaging of the right hand was performed according to the standard protocol. Multiplanar CT image reconstructions were also generated. COMPARISON:  None. FINDINGS: Bones/Joint/Cartilage Acute comminuted fracture at the base of the third metacarpal with 10 mm of dorsal displacement and without involvement of the articular surface. Comminuted fracture of the base of the fourth metacarpal with 10 mm of dorsal displacement involving the articular surface. Base of the fourth metacarpal is dorsally dislocated and perched on the distal dorsal corner of the capitate. Comminuted fracture of the distal dorsal aspect of the hamate with 9 mm of maximum dorsal displacement. Dorsal dislocation of the fifth metacarpal  base perched on the dorsal distal aspect of the hamate between the hamate and the dorsally displaced hamate fracture fragments. No other acute fracture or dislocation. No aggressive osseous lesion. No periosteal reaction. CMC joint effusion. Ligaments Suboptimally assessed by CT. Muscles and Tendons Muscles are normal. No muscle atrophy. Flexor and extensor compartment tendons are intact. Soft tissues No fluid collection or hematoma. No aggressive osseous lesion. Soft tissue edema around the dorsal aspect of the CMC joints. IMPRESSION: 1. Acute comminuted fracture at the base of the third metacarpal with 10 mm of dorsal displacement and without involvement of the articular surface. 2. Comminuted fracture of the base of the fourth metacarpal with 10 mm of dorsal displacement involving the articular surface. Base of the fourth metacarpal is dorsally dislocated and perched on the distal dorsal corner of the capitate. 3. Comminuted fracture of the distal dorsal aspect of the hamate with 9 mm of maximum dorsal displacement. 4. Dorsal dislocation of the fifth metacarpal base perched on the dorsal distal aspect of the hamate between the hamate and the dorsally displaced hamate fracture fragments. Electronically Signed   By: Kathreen Devoid   On: 01/12/2019 15:16   Dg Hand Complete Right  Result Date: 01/12/2019 CLINICAL DATA:  Trauma, injury, pain EXAM: RIGHT HAND - COMPLETE 3+ VIEW COMPARISON:  None. FINDINGS: There are acute fractures of the right third, fourth, and fifth metacarpal bases and associated dorsal dislocation of the fourth and fifth metacarpal bases in relation to the carpal bones. Osseous irregularity over the hamate bone. Difficult to exclude hamate fracture. Other carpal bones appear intact. Distal radius and ulna intact. Diffuse and soft tissue swelling. IMPRESSION: Acute right third, fourth and fifth metacarpal base fractures and associated fourth and fifth metacarpal base dislocations in relation to  the carpal bones. Difficult to exclude hamate carpal bone fracture as well Marked soft tissue swelling. Electronically Signed   By: Jerilynn Mages.  Shick M.D.   On: 01/12/2019 12:58    Procedures Procedures (including critical care time)  Medications Ordered in ED Medications  oxyCODONE (Oxy IR/ROXICODONE) immediate release tablet 5 mg (has no administration in time range)    Or  oxyCODONE (ROXICODONE) 5 MG/5ML solution 5 mg (has no administration in time range)  HYDROmorphone (DILAUDID) injection 0.25-0.5 mg (has no administration in time range)  promethazine (PHENERGAN) injection 6.25-12.5 mg (has no administration in time range)  ketorolac (TORADOL) 30 MG/ML injection 30 mg (has no administration in time range)  HYDROmorphone (DILAUDID) injection 0.5 mg (0.5 mg Intravenous Given 01/12/19 1843)  ondansetron (ZOFRAN) injection 4 mg (4 mg Intravenous Given 01/12/19 1841)  bupivacaine (MARCAINE) 0.5 % injection (has no administration in time  range)  bupivacaine (PF) (MARCAINE) 0.25 % injection (has no administration in time range)     Initial Impression / Assessment and Plan / ED Course  I have reviewed the triage vital signs and the nursing notes.  Pertinent labs & imaging results that were available during my care of the patient were reviewed by me and considered in my medical decision making (see chart for details).        Patient here with hand fracture.  He is scheduled for ORIF this evening with Dr. Merlyn LotKuzma.  The coronavirus test is pending.  Pain control initiated here.  Neurovascularly intact  Final Clinical Impressions(s) / ED Diagnoses   Final diagnoses:  Closed fracture of right hand, initial encounter    ED Discharge Orders         Ordered    oxyCODONE-acetaminophen (PERCOCET) 5-325 MG tablet     01/13/19 0002           Arthor CaptainHarris, Chyanna Flock, PA-C 01/13/19 0020    Tegeler, Canary Brimhristopher J, MD 01/14/19 1029

## 2019-01-12 NOTE — ED Triage Notes (Signed)
Pt REFUSES  COVID 19  Nasal swab. Pt pacing in room arguing with Family member in room. Pt reported he does not need that SH.Marland Kitchen

## 2019-01-12 NOTE — ED Notes (Signed)
Patient verbalizes understanding of discharge instructions . Opportunity for questions and answers were provided . Armband removed by staff ,Pt discharged from ED. W/C  offered at D/C  and Declined W/C at D/C and was escorted to lobby by RN.  

## 2019-01-13 MED ORDER — OXYCODONE HCL 5 MG PO TABS: 5.0000 mg | ORAL_TABLET | Freq: Once | ORAL | Status: DC | PRN

## 2019-01-13 MED ORDER — HYDROMORPHONE HCL 1 MG/ML IJ SOLN
0.2500 mg | INTRAMUSCULAR | Status: DC | PRN
  Administered 2019-01-13: 0.25 mg via INTRAVENOUS

## 2019-01-13 MED ORDER — KETOROLAC TROMETHAMINE 30 MG/ML IJ SOLN
INTRAMUSCULAR | Status: AC
  Filled 2019-01-13: qty 1

## 2019-01-13 MED ORDER — PROMETHAZINE HCL 25 MG/ML IJ SOLN: 6.2500 mg | INTRAMUSCULAR | Status: DC | PRN

## 2019-01-13 MED ORDER — OXYCODONE-ACETAMINOPHEN 5-325 MG PO TABS: ORAL_TABLET | ORAL | 0 refills | Status: DC

## 2019-01-13 MED ORDER — KETOROLAC TROMETHAMINE 30 MG/ML IJ SOLN
30.0000 mg | Freq: Once | INTRAMUSCULAR | Status: AC | PRN
  Administered 2019-01-13: 30 mg via INTRAVENOUS

## 2019-01-13 MED ORDER — HYDROMORPHONE HCL 1 MG/ML IJ SOLN
INTRAMUSCULAR | Status: AC
  Filled 2019-01-13: qty 1

## 2019-01-13 MED ORDER — OXYCODONE HCL 5 MG/5ML PO SOLN: 5.0000 mg | Freq: Once | ORAL | Status: DC | PRN

## 2019-01-13 NOTE — Progress Notes (Signed)
Notified Anesthesia MD pt's SBP 171 MD ok with sending pt home.  Pain controlled.

## 2019-01-13 NOTE — Anesthesia Postprocedure Evaluation (Signed)
Anesthesia Post Note  Patient: Thomas Clayton  Procedure(s) Performed: RIGHT HAND REDUCTION PINNING OF MULTIPLE METACARPALS DISLOCATIONS WITH PROPHYLACTIC FASCIOTOMY (Right )     Patient location during evaluation: PACU Anesthesia Type: General Level of consciousness: awake and alert Pain management: pain level controlled Vital Signs Assessment: post-procedure vital signs reviewed and stable Respiratory status: spontaneous breathing, nonlabored ventilation, respiratory function stable and patient connected to nasal cannula oxygen Cardiovascular status: blood pressure returned to baseline and stable Postop Assessment: no apparent nausea or vomiting Anesthetic complications: no    Last Vitals:  Vitals:   01/13/19 0015 01/13/19 0030  BP: (!) 171/98 (!) 171/99  Pulse: 91 94  Resp: (!) 28 17  Temp:  36.4 C  SpO2: 100% 100%    Last Pain:  Vitals:   01/13/19 0030  TempSrc:   PainSc: 0-No pain                 Tequila Rottmann P Fynlee Rowlands

## 2019-01-13 NOTE — Transfer of Care (Signed)
Immediate Anesthesia Transfer of Care Note  Patient: Thomas Clayton  Procedure(s) Performed: RIGHT HAND REDUCTION PINNING OF MULTIPLE METACARPALS DISLOCATIONS WITH PROPHYLACTIC FASCIOTOMY (Right )  Patient Location: PACU  Anesthesia Type:General  Level of Consciousness: drowsy, patient cooperative and responds to stimulation  Airway & Oxygen Therapy: Patient Spontanous Breathing  Post-op Assessment: Report given to RN and Post -op Vital signs reviewed and stable  Post vital signs: Reviewed and stable  Last Vitals:  Vitals Value Taken Time  BP 171/99 01/13/19 0030  Temp 36.4 C 01/13/19 0030  Pulse 88 01/13/19 0033  Resp 21 01/13/19 0033  SpO2 100 % 01/13/19 0033  Vitals shown include unvalidated device data.  Last Pain:  Vitals:   01/13/19 0030  TempSrc:   PainSc: 0-No pain         Complications: No apparent anesthesia complications

## 2019-01-13 NOTE — Discharge Instructions (Signed)

## 2019-01-14 ENCOUNTER — Encounter (HOSPITAL_COMMUNITY): Payer: Self-pay | Admitting: Orthopedic Surgery

## 2019-09-26 ENCOUNTER — Other Ambulatory Visit: Payer: Self-pay

## 2019-09-26 ENCOUNTER — Encounter (HOSPITAL_COMMUNITY): Payer: Self-pay

## 2019-09-26 ENCOUNTER — Ambulatory Visit (HOSPITAL_COMMUNITY)
Admission: EM | Admit: 2019-09-26 | Discharge: 2019-09-26 | Disposition: A | Discharge status: Home / Self Care | Payer: Self-pay | Attending: Emergency Medicine | Admitting: Emergency Medicine

## 2019-09-26 DIAGNOSIS — R369 Urethral discharge, unspecified: Secondary | ICD-10-CM | POA: Insufficient documentation

## 2019-09-26 DIAGNOSIS — Z202 Contact with and (suspected) exposure to infections with a predominantly sexual mode of transmission: Secondary | ICD-10-CM | POA: Insufficient documentation

## 2019-09-26 MED ORDER — DOXYCYCLINE HYCLATE 100 MG PO CAPS
100.0000 mg | ORAL_CAPSULE | Freq: Two times a day (BID) | ORAL | 0 refills | Status: AC
Start: 2019-09-26 — End: 2019-10-03

## 2019-09-26 MED ORDER — CEFTRIAXONE SODIUM 500 MG IJ SOLR
INTRAMUSCULAR | Status: AC
  Filled 2019-09-26: qty 500

## 2019-09-26 MED ORDER — CEFTRIAXONE SODIUM 500 MG IJ SOLR
500.0000 mg | Freq: Once | INTRAMUSCULAR | Status: AC
  Administered 2019-09-26: 500 mg via INTRAMUSCULAR

## 2019-09-26 MED ORDER — LIDOCAINE HCL (PF) 1 % IJ SOLN
INTRAMUSCULAR | Status: AC
  Filled 2019-09-26: qty 2

## 2019-09-26 NOTE — ED Triage Notes (Addendum)
Pt is here after being informed yesterday of his partner of 3 years testing POSITIVE for Chlamydia. They last had unprotected sex 2 day ago. Pt is having penile discharge and wants STD testing today.

## 2019-09-26 NOTE — ED Provider Notes (Signed)
[place]    CSN: [id number] Arrival date & time: 09/26/19  1013      History   Chief Complaint Chief Complaint  Patient presents with  . S74.5    HPI Thomas Clayton is a 24 y.o. male history of asthma presenting today for STD screening after exposure as well as penile discharge.  Patient notes that he began to develop penile discharge beginning yesterday.  Denies any other urinary symptoms of dysuria increased frequency or urgency.  Denies abdominal pain.  Denies rashes or lesions.  Notes that his partner tested positive for chlamydia.  He denies any other partners.  HPI  Past Medical History:  Diagnosis Date  . Asthma   . Multiple allergies   . Seasonal allergies     There are no problems to display for this patient.   Past Surgical History:  Procedure Laterality Date  . PERCUTANEOUS PINNING Right 01/12/2019   Procedure: RIGHT HAND REDUCTION PINNING OF MULTIPLE METACARPALS DISLOCATIONS WITH PROPHYLACTIC FASCIOTOMY;  Surgeon: Leanora Cover, MD;  Location: [place];  Service: Orthopedics;  Laterality: Right;       Home Medications    Prior to Admission medications   Medication Sig Start Date End Date Taking? Authorizing Provider  doxycycline (VIBRAMYCIN) 100 MG capsule Take 1 capsule (100 mg total) by mouth 2 (two) times daily for 7 days. 09/26/19 10/03/19  Charlyn Vialpando, Elesa Hacker, PA-C    Family History Family History  Problem Relation Age of Onset  . Healthy Mother   . Hypertension Father     Social History Social History   Tobacco Use  . Smoking status: Current Some Day Smoker    Packs/day: 0.25    Types: Cigarettes  . Smokeless tobacco: Never Used  Substance Use Topics  . Alcohol use: No  . Drug use: Yes    Types: Marijuana     Allergies   Other   Review of Systems Review of Systems  Constitutional: Negative for fever.  HENT: Negative for sore throat.   Respiratory: Negative for shortness of breath.   Cardiovascular: Negative for chest  pain.  Gastrointestinal: Negative for abdominal pain, nausea and vomiting.  Genitourinary: Positive for discharge. Negative for difficulty urinating, dysuria, frequency, penile pain, penile swelling, scrotal swelling and testicular pain.  Skin: Negative for rash.  Neurological: Negative for dizziness, light-headedness and headaches.     Physical Exam Triage Vital Signs ED Triage Vitals  Enc Vitals Group     BP 09/26/19 1053 107/62     Pulse Rate 09/26/19 1053 69     Resp 09/26/19 1053 17     Temp 09/26/19 1053 98.6 F (37 C)     Temp Source 09/26/19 1053 Oral     SpO2 09/26/19 1053 98 %     Weight 09/26/19 1053 204 lb (92.5 kg)     Height --      Head Circumference --      Peak Flow --      Pain Score 09/26/19 1052 0     Pain Loc --      Pain Edu? --      Excl. in [place]? --    No data found.  Updated Vital Signs BP 107/62 (BP Location: Right Arm)   Pulse 69   Temp 98.6 F (37 C) (Oral)   Resp 17   Wt 204 lb (92.5 kg)   SpO2 98%   BMI 32.93 kg/m   Visual Acuity Right Eye Distance:   Left Eye  Distance:   Bilateral Distance:    Right Eye Near:   Left Eye Near:    Bilateral Near:     Physical Exam Vitals and nursing note reviewed.  Constitutional:      Appearance: He is well-developed.     Comments: No acute distress  HENT:     Head: Normocephalic and atraumatic.     Nose: Nose normal.  Eyes:     Conjunctiva/sclera: Conjunctivae normal.  Cardiovascular:     Rate and Rhythm: Normal rate.  Pulmonary:     Effort: Pulmonary effort is normal. No respiratory distress.  Abdominal:     General: There is no distension.  Musculoskeletal:        General: Normal range of motion.     Cervical back: Neck supple.  Skin:    General: Skin is warm and dry.  Neurological:     Mental Status: He is alert and oriented to person, place, and time.      UC Treatments / Results  Labs (all labs ordered are listed, but only abnormal results are displayed) Labs Reviewed    CYTOLOGY, (ORAL, ANAL, URETHRAL) ANCILLARY ONLY    EKG   Radiology No results found.  Procedures Procedures (including critical care time)  Medications Ordered in UC Medications  cefTRIAXone (ROCEPHIN) injection 500 mg (has no administration in time range)    Initial Impression / Assessment and Plan / UC Course  I have reviewed the triage vital signs and the nursing notes.  Pertinent labs & imaging results that were available during my care of the patient were reviewed by me and considered in my medical decision making (see chart for details).     24 year old male with penile discharge, chlamydia exposure.  Empirically treating for gonorrhea and chlamydia today with Rocephin, initiated on doxycycline twice daily x1 week.  Urethral swab obtained and will send off to check for gonorrhea, chlamydia and trichomonas.  Will call with results and provide further treatment as needed.  Discussed strict return precautions. Patient verbalized understanding and is agreeable with plan.  Final Clinical Impressions(s) / UC Diagnoses   Final diagnoses:  Penile discharge  Exposure to STD     Discharge Instructions     We have treated you today for gonorrhea with rocephin. Begin doxycyline twice daily for 1 week to treat chlamydia. Please refrain from sexual activity for 7 days while medicine is clearing infection.  We are testing you for Gonorrhea, Chlamydia and Trichomonas. We will call you if anything is positive and let you know if you require any further treatment. Please inform partner of any positive results.  Please return if symptoms not improving with treatment, development of fever, nausea, vomiting, abdominal pain, scrotal pain.    ED Prescriptions    Medication Sig Dispense Auth. Provider   doxycycline (VIBRAMYCIN) 100 MG capsule Take 1 capsule (100 mg total) by mouth 2 (two) times daily for 7 days. 14 capsule Quentina Fronek, [place] C, PA-C     PDMP not reviewed this  encounter.   Lew Dawes, PA-C 09/26/19 1141

## 2019-09-26 NOTE — Discharge Instructions (Signed)
We have treated you today for gonorrhea with rocephin. Begin doxycyline twice daily for 1 week to treat chlamydia. Please refrain from sexual activity for 7 days while medicine is clearing infection.  We are testing you for Gonorrhea, Chlamydia and Trichomonas. We will call you if anything is positive and let you know if you require any further treatment. Please inform partner of any positive results.  Please return if symptoms not improving with treatment, development of fever, nausea, vomiting, abdominal pain, scrotal pain.

## 2019-09-27 ENCOUNTER — Telehealth (HOSPITAL_COMMUNITY): Payer: Self-pay

## 2019-09-27 LAB — CYTOLOGY, (ORAL, ANAL, URETHRAL) ANCILLARY ONLY
Chlamydia: NEGATIVE
Comment: NEGATIVE
Comment: NEGATIVE
Comment: NORMAL
Neisseria Gonorrhea: NEGATIVE
Trichomonas: POSITIVE — AB

## 2019-09-27 MED ORDER — METRONIDAZOLE 500 MG PO TABS
500.0000 mg | ORAL_TABLET | Freq: Two times a day (BID) | ORAL | 0 refills | Status: AC
Start: 2019-09-27 — End: ?

## 2020-06-26 IMAGING — CR RIGHT HAND - COMPLETE 3+ VIEW
3 series · 3 of 3 positions shown · non-contrast
Comparison: None.

CLINICAL DATA: Trauma, injury, pain

EXAM:
RIGHT HAND - COMPLETE 3+ VIEW

[hand pa]
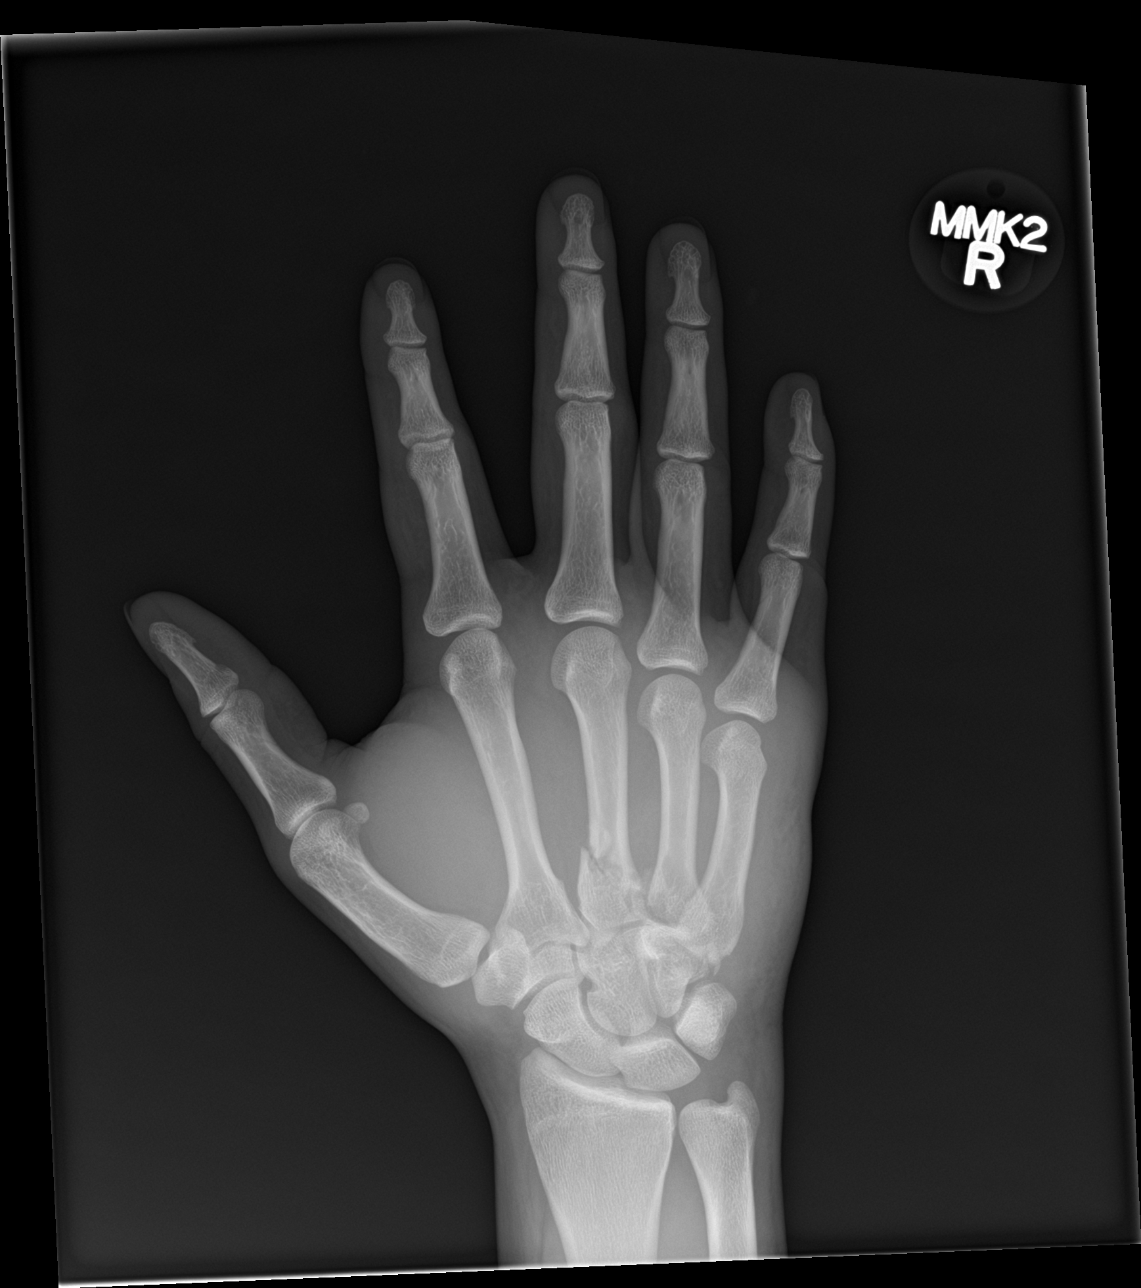

[hand obl]
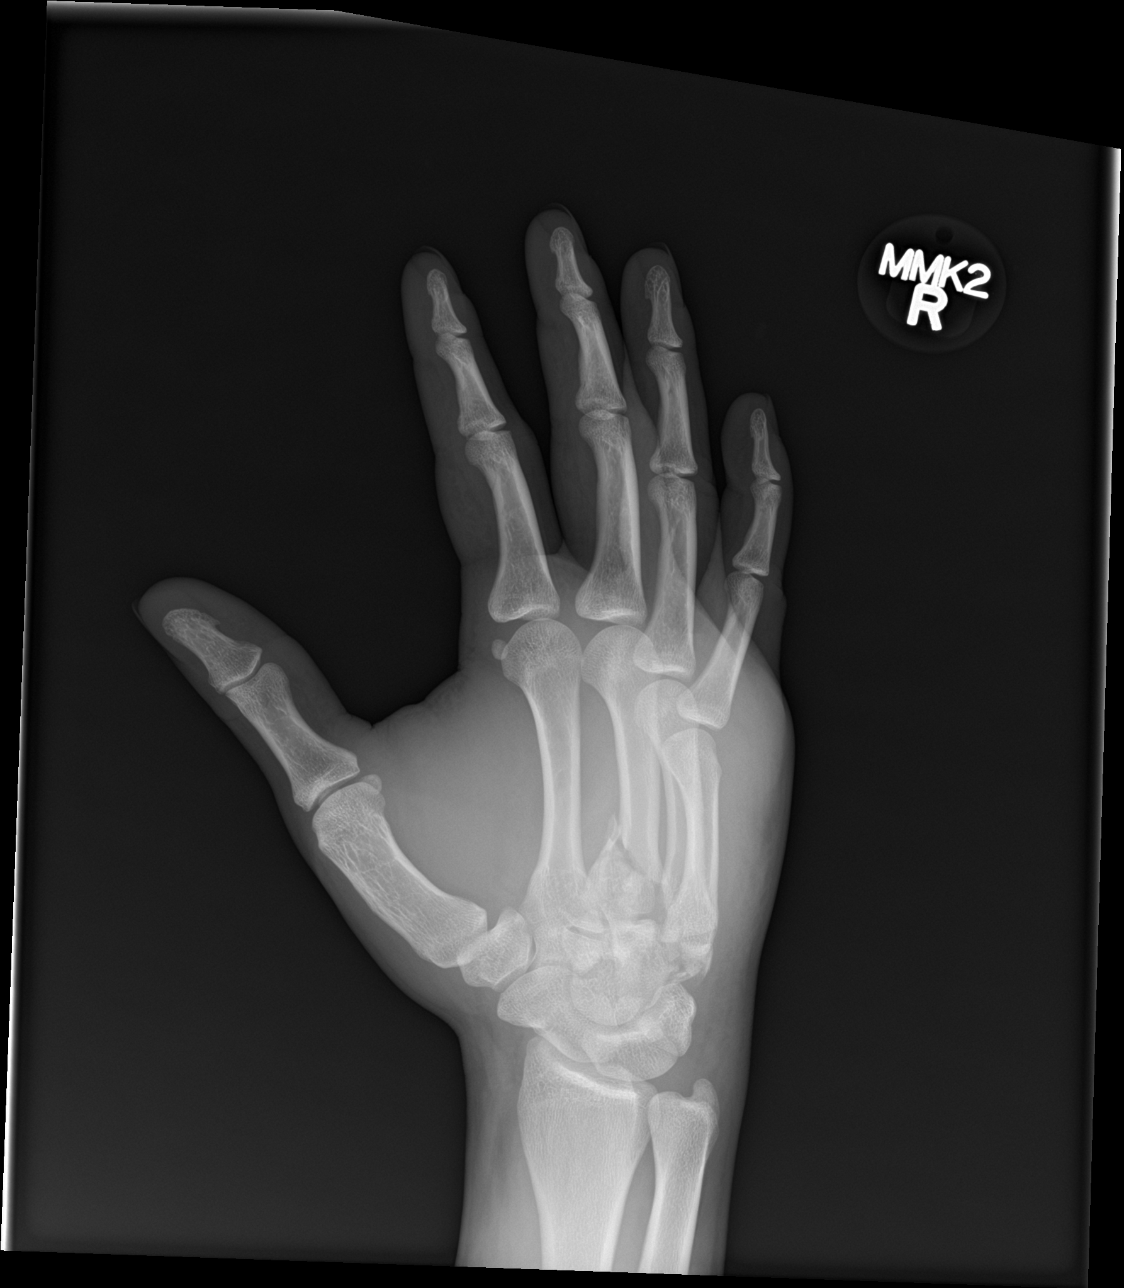

[hand lat]
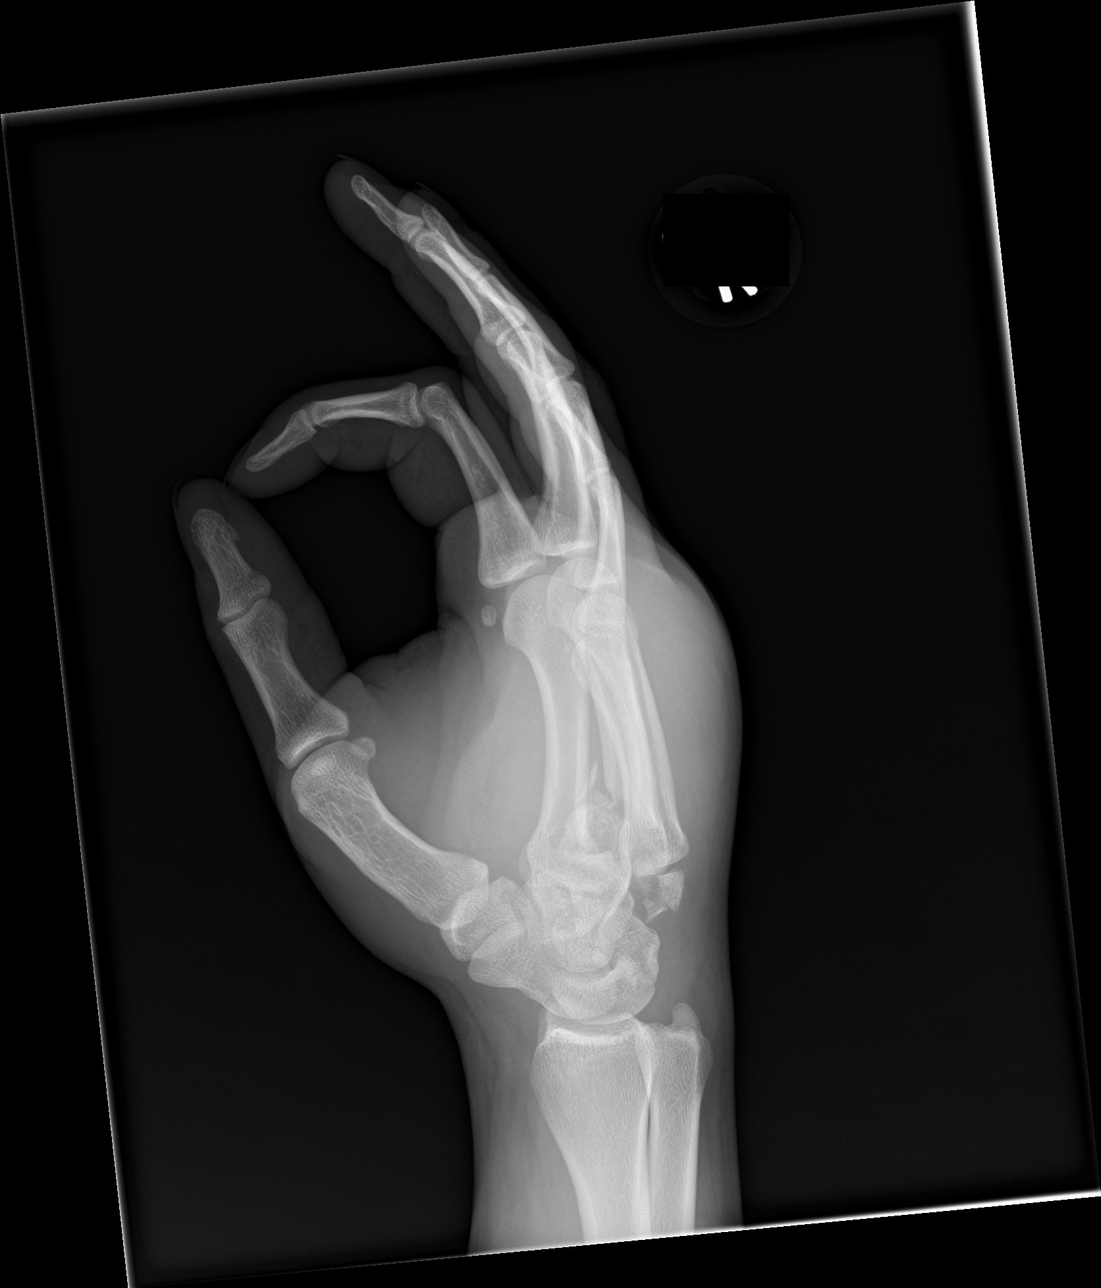

[3 of 3 positions shown; findings below may reference images not displayed]

FINDINGS: There are acute fractures of the right third, fourth, and fifth
metacarpal bases and associated dorsal dislocation of the fourth and
fifth metacarpal bases in relation to the carpal bones. Osseous
irregularity over the hamate bone. Difficult to exclude hamate
fracture. Other carpal bones appear intact. Distal radius and ulna
intact. Diffuse and soft tissue swelling.
IMPRESSION: Acute right third, fourth and fifth metacarpal base fractures and
associated fourth and fifth metacarpal base dislocations in relation
to the carpal bones.

Difficult to exclude hamate carpal bone fracture as well

Marked soft tissue swelling.
